# Patient Record
Sex: Female | Born: 1955 | Race: White | Hispanic: No | Marital: Single | Smoking: Former smoker
Health system: Southern US, Community
[De-identification: ages and names within clinical notes are randomized; demographics above are authoritative.]

## PROBLEM LIST (undated history)

## (undated) HISTORY — PX: TONSILLECTOMY: SUR1361

---

## 1994-11-09 HISTORY — PX: ABDOMINAL HYSTERECTOMY: SHX81

## 2016-10-01 ENCOUNTER — Encounter (HOSPITAL_BASED_OUTPATIENT_CLINIC_OR_DEPARTMENT_OTHER): Payer: Self-pay | Admitting: *Deleted

## 2016-10-01 ENCOUNTER — Emergency Department (HOSPITAL_BASED_OUTPATIENT_CLINIC_OR_DEPARTMENT_OTHER): Payer: BC Managed Care – PPO

## 2016-10-01 ENCOUNTER — Emergency Department (HOSPITAL_BASED_OUTPATIENT_CLINIC_OR_DEPARTMENT_OTHER)
Admission: EM | Admit: 2016-10-01 | Discharge: 2016-10-01 | Disposition: A | Discharge status: Home / Self Care | Payer: BC Managed Care – PPO | Attending: Emergency Medicine | Admitting: Emergency Medicine

## 2016-10-01 DIAGNOSIS — S0101XA Laceration without foreign body of scalp, initial encounter: Secondary | ICD-10-CM

## 2016-10-01 DIAGNOSIS — Y92000 Kitchen of unspecified non-institutional (private) residence as  the place of occurrence of the external cause: Secondary | ICD-10-CM | POA: Diagnosis not present

## 2016-10-01 DIAGNOSIS — Y9301 Activity, walking, marching and hiking: Secondary | ICD-10-CM | POA: Diagnosis not present

## 2016-10-01 DIAGNOSIS — W1839XA Other fall on same level, initial encounter: Secondary | ICD-10-CM | POA: Insufficient documentation

## 2016-10-01 DIAGNOSIS — R55 Syncope and collapse: Secondary | ICD-10-CM | POA: Diagnosis not present

## 2016-10-01 DIAGNOSIS — Y999 Unspecified external cause status: Secondary | ICD-10-CM | POA: Insufficient documentation

## 2016-10-01 DIAGNOSIS — S0990XA Unspecified injury of head, initial encounter: Secondary | ICD-10-CM | POA: Diagnosis present

## 2016-10-01 DIAGNOSIS — Z23 Encounter for immunization: Secondary | ICD-10-CM | POA: Insufficient documentation

## 2016-10-01 LAB — BASIC METABOLIC PANEL
Anion gap: 8 (ref 5–15)
BUN: 15 mg/dL (ref 6–20)
CHLORIDE: 101 mmol/L (ref 101–111)
CO2: 29 mmol/L (ref 22–32)
CREATININE: 0.84 mg/dL (ref 0.44–1.00)
Calcium: 9.4 mg/dL (ref 8.9–10.3)
GFR calc Af Amer: >60 mL/min (ref >60)
GFR calc non Af Amer: >60 mL/min (ref >60)
GLUCOSE: 131 mg/dL — AB (ref 65–99)
Potassium: 3.9 mmol/L (ref 3.5–5.1)
SODIUM: 138 mmol/L (ref 135–145)

## 2016-10-01 LAB — CBC WITH DIFFERENTIAL/PLATELET
Basophils Absolute: 0 10*3/uL (ref 0.0–0.1)
Basophils Relative: 0 %
EOS ABS: 0.1 10*3/uL (ref 0.0–0.7)
Eosinophils Relative: 1 %
HCT: 42.6 % (ref 36.0–46.0)
HEMOGLOBIN: 13.9 g/dL (ref 12.0–15.0)
LYMPHS ABS: 1.1 10*3/uL (ref 0.7–4.0)
LYMPHS PCT: 14 %
MCH: 29.8 pg (ref 26.0–34.0)
MCHC: 32.6 g/dL (ref 30.0–36.0)
MCV: 91.4 fL (ref 78.0–100.0)
MONOS PCT: 5 %
Monocytes Absolute: 0.4 10*3/uL (ref 0.1–1.0)
NEUTROS PCT: 80 %
Neutro Abs: 6.6 10*3/uL (ref 1.7–7.7)
Platelets: 177 10*3/uL (ref 150–400)
RBC: 4.66 MIL/uL (ref 3.87–5.11)
RDW: 13.9 % (ref 11.5–15.5)
WBC: 8.3 10*3/uL (ref 4.0–10.5)

## 2016-10-01 LAB — CBG MONITORING, ED
Comment 1: 30709026
GLUCOSE-CAPILLARY: 104 mg/dL — AB (ref 65–99)

## 2016-10-01 MED ORDER — LIDOCAINE-EPINEPHRINE (PF) 1 %-1:200000 IJ SOLN
INTRAMUSCULAR | Status: AC
  Filled 2016-10-01: qty 30

## 2016-10-01 MED ORDER — LIDOCAINE-EPINEPHRINE 2 %-1:100000 IJ SOLN
20.0000 mL | Freq: Once | INTRAMUSCULAR | Status: AC
  Administered 2016-10-01: 20 mL via INTRADERMAL

## 2016-10-01 MED ORDER — SODIUM CHLORIDE 0.9 % IV SOLN
INTRAVENOUS | Status: DC
  Administered 2016-10-01: 13:00:00 via INTRAVENOUS

## 2016-10-01 MED ORDER — TETANUS-DIPHTH-ACELL PERTUSSIS 5-2.5-18.5 LF-MCG/0.5 IM SUSP
0.5000 mL | Freq: Once | INTRAMUSCULAR | Status: AC
  Administered 2016-10-01: 0.5 mL via INTRAMUSCULAR
  Filled 2016-10-01: qty 0.5

## 2016-10-01 NOTE — ED Notes (Signed)
Returned from xray

## 2016-10-01 NOTE — ED Triage Notes (Signed)
[facility] reports patient was standing and went to the refrigerator to get something and had a witnessed fall.  Patient fell and hit her head on a knob.  Deep laceration to right scalp.  LOC for several seconds and came too.  CBG 136, VS: 140/68, hr 72, RR 18.  #18 G angio cath inserted in left AC.

## 2016-10-01 NOTE — ED Notes (Signed)
Patient denies pain and is resting comfortably.  

## 2016-10-01 NOTE — ED Triage Notes (Signed)
Patient states she was up in the kitchen getting her food ready for dinner, had not eaten anything and had had a couple cups of coffee.  States she began to have nausea and dizziness, and walked to the refridgerator to get some water, fainted and fell, striking the right lateral scalp.  Deep laceration noted.

## 2016-10-01 NOTE — Discharge Instructions (Signed)
Follow up with a primary care doctor or urgent care, Staple removal in 7-10 days.  Return as needed for recurrent symptoms, severe headache, other concerns

## 2016-10-01 NOTE — ED Provider Notes (Signed)
[facility] Provider Note   CSN: [id number] Arrival date & time: 10/01/16  1110     History   Chief Complaint Chief Complaint  Patient presents with  . Loss of Consciousness    HPI Megan Stephens is a 60 y.o. female.  HPI Patient presents to the emergency room after having a syncopal episode and sustaining a head injury. Patient was up this morning. She had a couple cups of coffee before getting anything to eat. She started to feel nauseated and became lightheaded. She began to walk to the refrigerator to get some water when she had a brief syncopal episode and fell hitting her head on a knob.  She briefly lost consciousness for a few seconds.   Patient denied any confusion after the event .  She sustained a laceration to the back of her scalp. 911 was called and the patient was brought to the emergency room.  Right now the patient has a headache at the location of her laceration. She denies any dizziness. No numbness or weakness. No chest pain or shortness of breath. No history of any syncopal episodes. History reviewed. No pertinent past medical history.  There are no active problems to display for this patient.   Past Surgical History:  Procedure Laterality Date  . ABDOMINAL HYSTERECTOMY  1996    OB History    No data available       Home Medications    Prior to Admission medications   Not on File    Family History No family history on file.  Social History Social History  Substance Use Topics  . Smoking status: Former Smoker    Quit date: 1982  . Smokeless tobacco: Never Used  . Alcohol use Yes     Comment: rare     Allergies   Patient has no known allergies.   Review of Systems Review of Systems  All other systems reviewed and are negative.    Physical Exam Updated Vital Signs BP (!) 151/129   Pulse 73   Temp 97.8 F (36.6 C) (Oral)   Resp 22   Ht 5' 5.5" (1.664 m)   Wt 112 kg   SpO2 100%   BMI 40.48 kg/m   Physical Exam    Constitutional: She appears well-developed and well-nourished. No distress.  HENT:  Head: Normocephalic.  Right Ear: External ear normal.  Left Ear: External ear normal.  Laceration right posterior scalp, matted blood in her hair  Eyes: Conjunctivae are normal. Right eye exhibits no discharge. Left eye exhibits no discharge. No scleral icterus.  Neck: Neck supple. No tracheal deviation present.  Cardiovascular: Normal rate, regular rhythm and intact distal pulses.   Pulmonary/Chest: Effort normal and breath sounds normal. No stridor. No respiratory distress. She has no wheezes. She has no rales.  Abdominal: Soft. Bowel sounds are normal. She exhibits no distension. There is no tenderness. There is no rebound and no guarding.  Musculoskeletal: She exhibits no edema or tenderness.  Entire spine nontender, no tenderness in her extremity  Neurological: She is alert. She has normal strength. No cranial nerve deficit (no facial droop, extraocular movements intact, no slurred speech) or sensory deficit. She exhibits normal muscle tone. She displays no seizure activity. Coordination normal.  Skin: Skin is warm and dry. No rash noted.  Psychiatric: She has a normal mood and affect.  Nursing note and vitals reviewed.    ED Treatments / Results  Labs (all labs ordered are listed, but only abnormal results  are displayed) Labs Reviewed  BASIC METABOLIC PANEL - Abnormal; Notable for the following:       Result Value   Glucose, Bld 131 (*)    All other components within normal limits  CBG MONITORING, ED - Abnormal; Notable for the following:    Glucose-Capillary 104 (*)    All other components within normal limits  CBC WITH DIFFERENTIAL/PLATELET    EKG  EKG Interpretation  Date/Time:  Thursday October 01 2016 11:23:35 EST Ventricular Rate:  70 PR Interval:    QRS Duration: 86 QT Interval:  405 QTC Calculation: 437 R Axis:   43 Text Interpretation:  Sinus rhythm Low voltage, precordial  leads No previous tracing Confirmed by Tasneem Cormier  MD-J, Argusta Mcgann ([id number]) on 10/01/2016 11:38:40 AM       Radiology Ct Head Wo Contrast  Result Date: 10/01/2016 CLINICAL DATA:  Fall, head injury, right parietal swelling and laceration EXAM: CT HEAD WITHOUT CONTRAST TECHNIQUE: Contiguous axial images were obtained from the base of the skull through the vertex without intravenous contrast. COMPARISON:  None available FINDINGS: Brain: No evidence of acute infarction, hemorrhage, hydrocephalus, extra-axial collection or mass lesion/mass effect. Vascular: No hyperdense vessel or unexpected calcification. Skull: Negative for fracture. High right parietal superficial scalp hematoma noted. Sinuses/Orbits: No acute finding. Other: None. IMPRESSION: Small right parietal scalp hematoma. No acute intracranial finding. Electronically Signed   By: Judie PetitM.  Shick M.D.   On: 10/01/2016 12:12    Procedures .Marland Kitchen.Laceration Repair Date/Time: 10/01/2016 12:34 PM Performed by: Linwood DibblesKNAPP, Lauran Romanski Authorized by: Linwood DibblesKNAPP, Kearstin Learn   Consent:    Consent obtained:  Verbal   Consent given by:  Patient   Risks discussed:  Infection, need for additional repair, pain, poor cosmetic result and poor wound healing   Alternatives discussed:  No treatment and delayed treatment Anesthesia (see MAR for exact dosages):    Anesthesia method:  Local infiltration   Local anesthetic:  Lidocaine 1% WITH epi Laceration details:    Location:  Scalp Repair type:    Repair type:  Simple Pre-procedure details:    Preparation:  Patient was prepped and draped in usual sterile fashion Exploration:    Contaminated: no   Treatment:    Area cleansed with:  Shur-Clens   Amount of cleaning:  Standard   Irrigation method:  Pressure wash   Visualized foreign bodies/material removed: no   Skin repair:    Repair method:  Staples   Number of staples:  4 Approximation:    Approximation:  Close   Vermilion border: well-aligned   Post-procedure details:    Dressing:   Open (no dressing)   Patient tolerance of procedure:  Tolerated well, no immediate complications    (including critical care time)  Medications Ordered in ED Medications  0.9 %  sodium chloride infusion ( Intravenous New Bag/Given 10/01/16 1238)  lidocaine-EPINEPHrine (XYLOCAINE-EPINEPHrine) 1 %-1:200000 (PF) injection (not administered)  Tdap (BOOSTRIX) injection 0.5 mL (0.5 mLs Intramuscular Given 10/01/16 1234)  lidocaine-EPINEPHrine (XYLOCAINE W/EPI) 2 %-1:100000 (with pres) injection 20 mL (20 mLs Intradermal Given by Other 10/01/16 1234)     Initial Impression / Assessment and Plan / ED Course  I have reviewed the triage vital signs and the nursing notes.  Pertinent labs & imaging results that were available during my care of the patient were reviewed by me and considered in my medical decision making (see chart for details).  Clinical Course       Pt has no risk factors for cardiac dysrhythmia.  No chest pain  or shortness of breath.  Doubt cardiac or pulmonary etiology.  Sx not suggestive of TIA, stroke. Most likely a vasovagal episode.  Laceration repaired without difficulty.  Discu ssed dc and follow up plans  Final Clinical Impressions(s) / ED Diagnoses   Final diagnoses:  Syncope and collapse  Laceration of scalp, initial encounter    New Prescriptions New Prescriptions   No medications on file     Linwood Dibbles, MD 10/01/16 1255

## 2018-06-04 IMAGING — CT CT HEAD W/O CM
3 series · 16 of 47 positions shown, 19 images · non-contrast
Comparison: None available

CLINICAL DATA: Fall, head injury, right parietal swelling and
laceration

EXAM:
CT HEAD WITHOUT CONTRAST
TECHNIQUE: Contiguous axial images were obtained from the base of the skull
through the vertex without intravenous contrast.

[Series 2: head wo · axial · 0.42mm/px · z∈[-211,-66]mm · 10 of 35 slices shown, 13 images]
[im 3/35  brain]
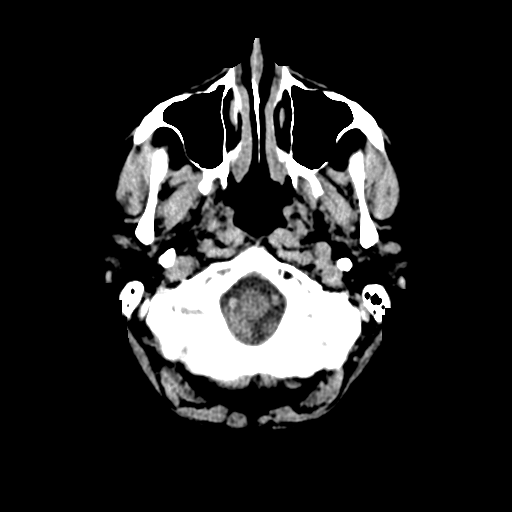
[im 3/35  bone]
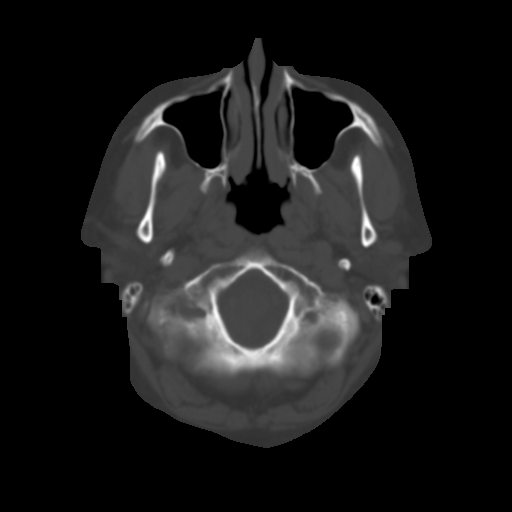
[im 6/35  brain]
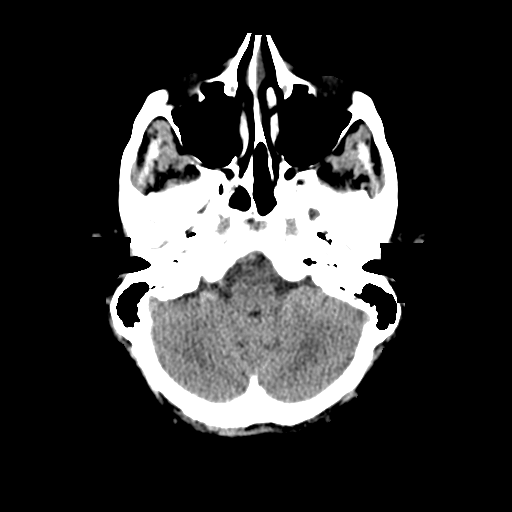
[im 10/35  brain]
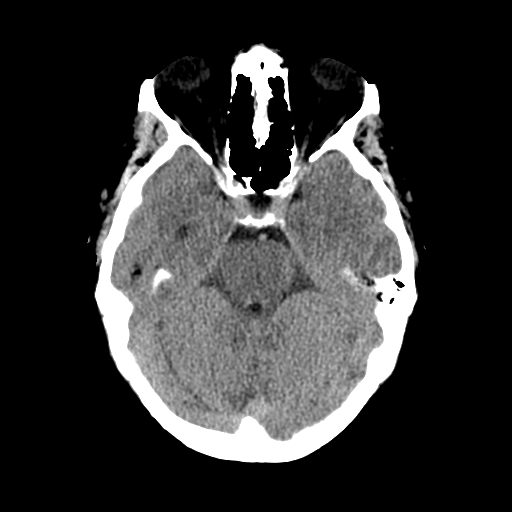
[im 12/35  brain]
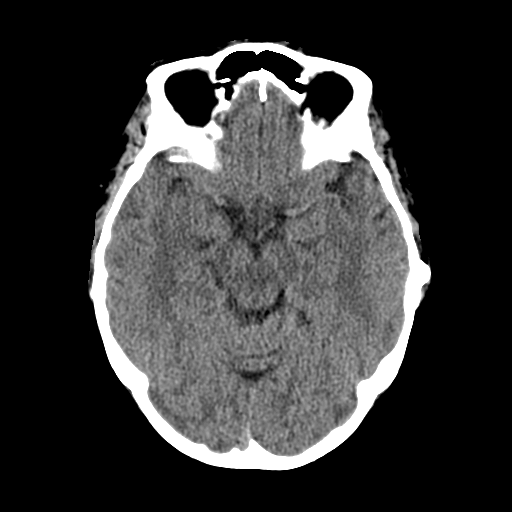
[im 16/35  brain]
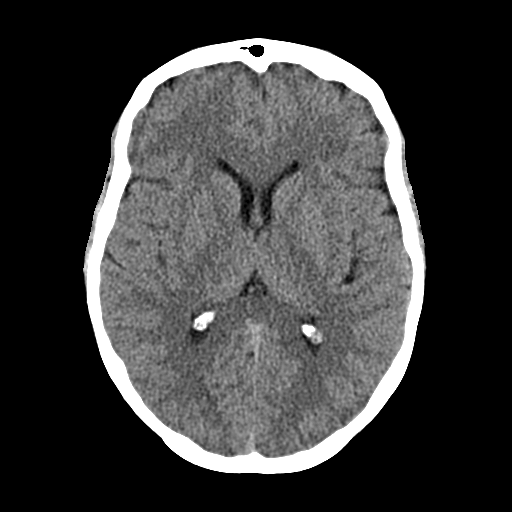
[im 16/35  bone]
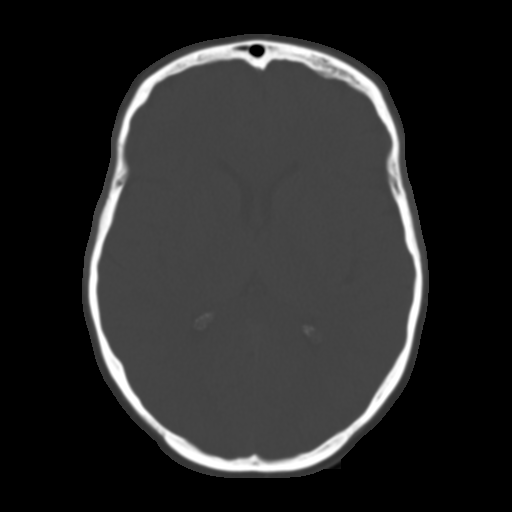
[im 19/35  brain]
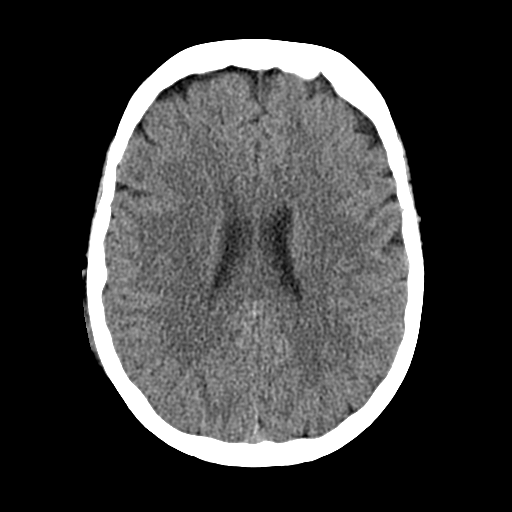
[im 23/35  brain]
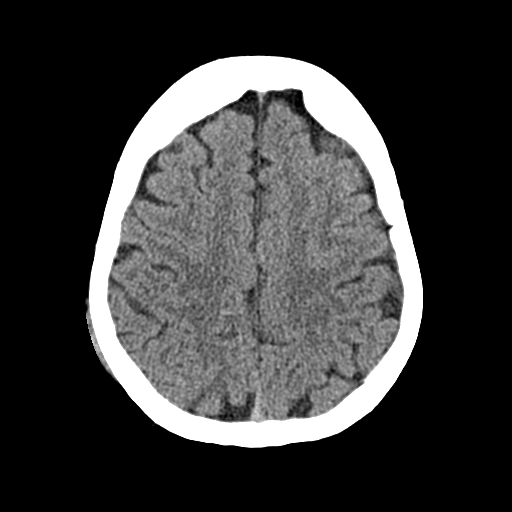
[im 26/35  brain]
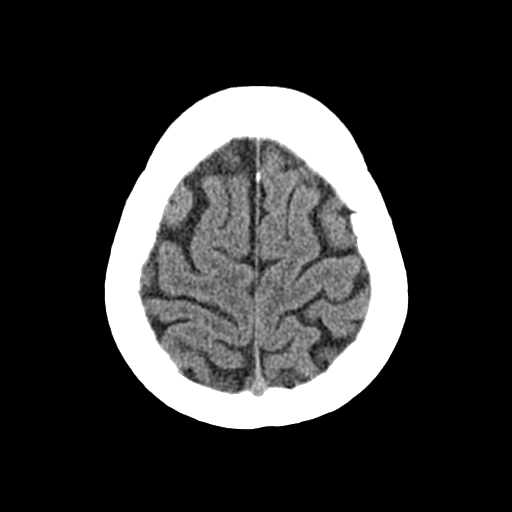
[im 29/35  brain]
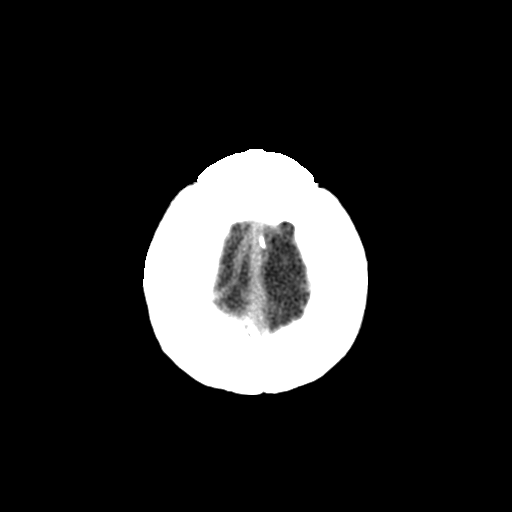
[im 29/35  bone]
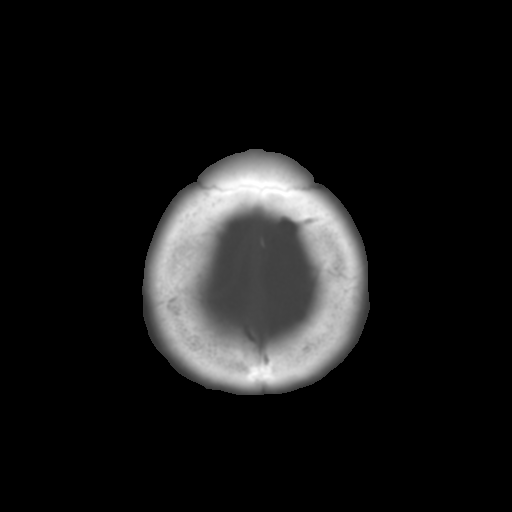
[im 32/35  brain]
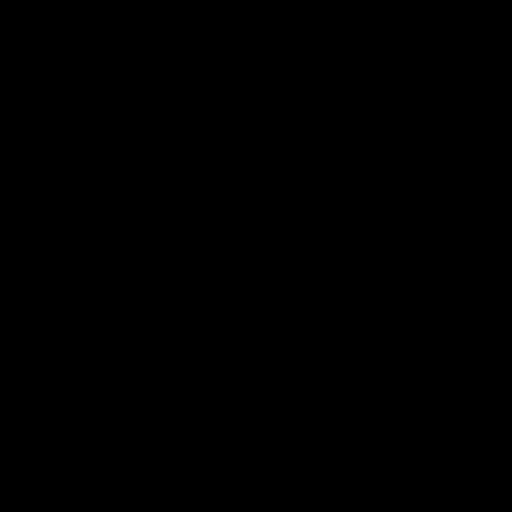

[Series 4: cor soft · coronal · 0.33mm/px · 3 of 67 slices shown]
[im 23/67  brain]
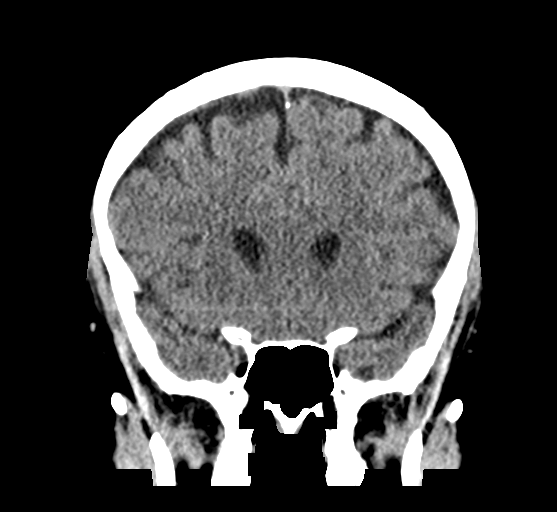
[im 30/67  brain]
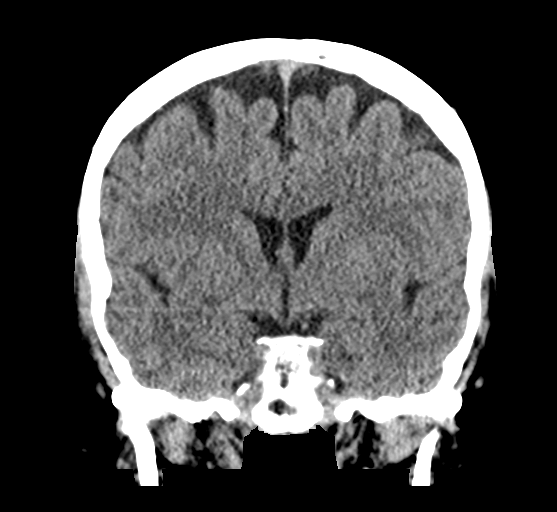
[im 37/67  brain]
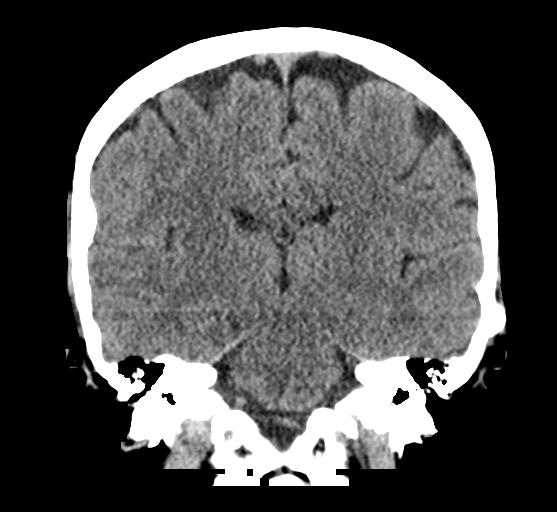

[Series 5: sag soft · sagittal · 0.35mm/px · 3 of 54 slices shown]
[im 18/54  brain]
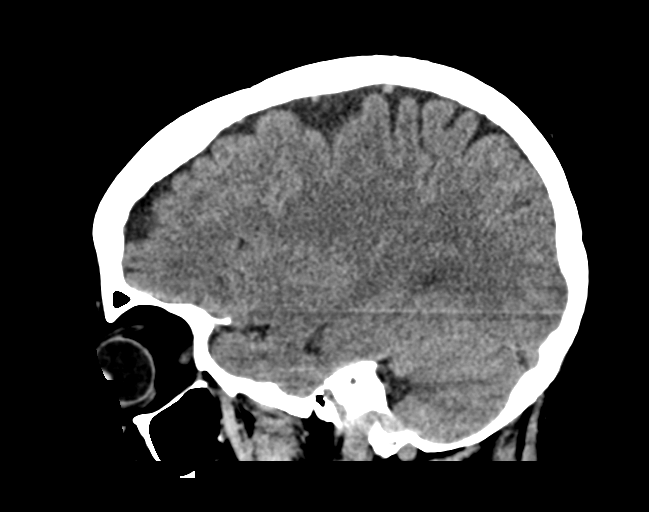
[im 27/54  brain]
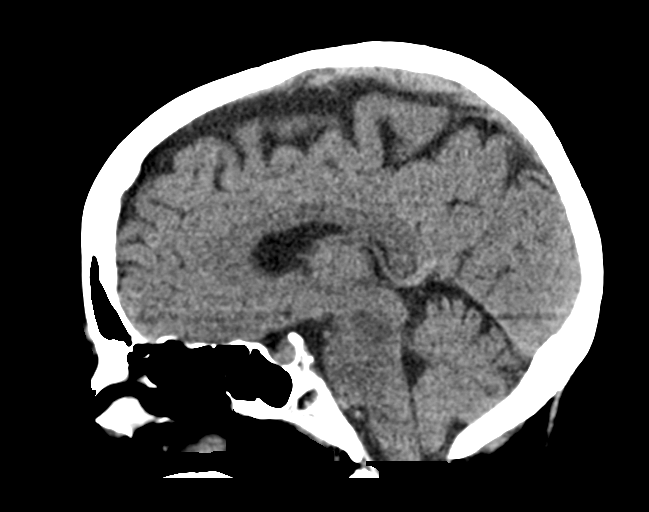
[im 36/54  brain]
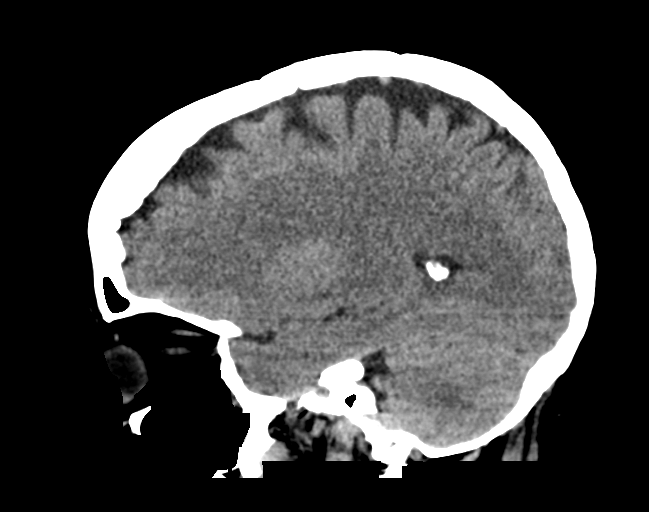

[16 of 47 positions shown; findings below may reference images not displayed]

FINDINGS: Brain: No evidence of acute infarction, hemorrhage, hydrocephalus,
extra-axial collection or mass lesion/mass effect.

Vascular: No hyperdense vessel or unexpected calcification.

Skull: Negative for fracture. High right parietal superficial scalp
hematoma noted.

Sinuses/Orbits: No acute finding.

Other: None.
IMPRESSION: Small right parietal scalp hematoma.

No acute intracranial finding.

## 2019-10-23 ENCOUNTER — Other Ambulatory Visit: Payer: Self-pay | Admitting: Orthopedic Surgery

## 2019-10-23 ENCOUNTER — Other Ambulatory Visit: Payer: Self-pay

## 2019-10-23 ENCOUNTER — Encounter (HOSPITAL_BASED_OUTPATIENT_CLINIC_OR_DEPARTMENT_OTHER): Payer: Self-pay | Admitting: Orthopedic Surgery

## 2019-10-24 ENCOUNTER — Other Ambulatory Visit (HOSPITAL_COMMUNITY)
Admission: RE | Admit: 2019-10-24 | Discharge: 2019-10-24 | Disposition: A | Discharge status: Home / Self Care | Payer: Medicare Other | Source: Ambulatory Visit | Attending: Orthopedic Surgery | Admitting: Orthopedic Surgery

## 2019-10-24 DIAGNOSIS — Z20828 Contact with and (suspected) exposure to other viral communicable diseases: Secondary | ICD-10-CM | POA: Insufficient documentation

## 2019-10-24 DIAGNOSIS — Z01812 Encounter for preprocedural laboratory examination: Secondary | ICD-10-CM | POA: Diagnosis present

## 2019-10-24 NOTE — Progress Notes (Signed)

## 2019-10-25 LAB — NOVEL CORONAVIRUS, NAA (HOSP ORDER, SEND-OUT TO REF LAB; TAT 18-24 HRS): SARS-CoV-2, NAA: NOT DETECTED

## 2019-10-27 ENCOUNTER — Ambulatory Visit (HOSPITAL_BASED_OUTPATIENT_CLINIC_OR_DEPARTMENT_OTHER): Payer: Medicare Other | Admitting: Anesthesiology

## 2019-10-27 ENCOUNTER — Encounter (HOSPITAL_BASED_OUTPATIENT_CLINIC_OR_DEPARTMENT_OTHER)
Admission: RE | Disposition: A | Discharge status: Home / Self Care | Payer: Self-pay | Source: Home / Self Care | Attending: Orthopedic Surgery

## 2019-10-27 ENCOUNTER — Ambulatory Visit (HOSPITAL_BASED_OUTPATIENT_CLINIC_OR_DEPARTMENT_OTHER)
Admission: RE | Admit: 2019-10-27 | Discharge: 2019-10-27 | Disposition: A | Discharge status: Home / Self Care | Payer: Medicare Other | Attending: Orthopedic Surgery | Admitting: Orthopedic Surgery

## 2019-10-27 ENCOUNTER — Encounter (HOSPITAL_BASED_OUTPATIENT_CLINIC_OR_DEPARTMENT_OTHER): Payer: Self-pay | Admitting: Orthopedic Surgery

## 2019-10-27 DIAGNOSIS — S52571A Other intraarticular fracture of lower end of right radius, initial encounter for closed fracture: Secondary | ICD-10-CM | POA: Insufficient documentation

## 2019-10-27 DIAGNOSIS — W19XXXA Unspecified fall, initial encounter: Secondary | ICD-10-CM | POA: Insufficient documentation

## 2019-10-27 DIAGNOSIS — Z87891 Personal history of nicotine dependence: Secondary | ICD-10-CM | POA: Insufficient documentation

## 2019-10-27 DIAGNOSIS — Z6839 Body mass index (BMI) 39.0-39.9, adult: Secondary | ICD-10-CM | POA: Insufficient documentation

## 2019-10-27 DIAGNOSIS — S52501A Unspecified fracture of the lower end of right radius, initial encounter for closed fracture: Secondary | ICD-10-CM | POA: Diagnosis present

## 2019-10-27 HISTORY — PX: OPEN REDUCTION INTERNAL FIXATION (ORIF) DISTAL RADIAL FRACTURE: SHX5989

## 2019-10-27 SURGERY — OPEN REDUCTION INTERNAL FIXATION (ORIF) DISTAL RADIUS FRACTURE
Anesthesia: General | Site: Wrist | Laterality: Right

## 2019-10-27 MED ORDER — ROPIVACAINE HCL 5 MG/ML IJ SOLN
INTRAMUSCULAR | Status: DC | PRN
  Administered 2019-10-27: 30 mg via PERINEURAL

## 2019-10-27 MED ORDER — CEFAZOLIN SODIUM-DEXTROSE 2-4 GM/100ML-% IV SOLN
INTRAVENOUS | Status: AC
  Filled 2019-10-27: qty 100

## 2019-10-27 MED ORDER — CHLORHEXIDINE GLUCONATE 4 % EX LIQD: 60.0000 mL | Freq: Once | CUTANEOUS | Status: DC

## 2019-10-27 MED ORDER — ACETAMINOPHEN 500 MG PO TABS
1000.0000 mg | ORAL_TABLET | Freq: Once | ORAL | Status: AC
  Administered 2019-10-27: 1000 mg via ORAL

## 2019-10-27 MED ORDER — FENTANYL CITRATE (PF) 100 MCG/2ML IJ SOLN
50.0000 ug | INTRAMUSCULAR | Status: DC | PRN
  Administered 2019-10-27: 100 ug via INTRAVENOUS

## 2019-10-27 MED ORDER — ROPIVACAINE HCL 5 MG/ML IJ SOLN: INTRAMUSCULAR | Status: DC | PRN

## 2019-10-27 MED ORDER — DEXAMETHASONE SODIUM PHOSPHATE 10 MG/ML IJ SOLN
INTRAMUSCULAR | Status: DC | PRN
  Administered 2019-10-27: 5 mg via INTRAVENOUS

## 2019-10-27 MED ORDER — PROPOFOL 500 MG/50ML IV EMUL
INTRAVENOUS | Status: AC
  Filled 2019-10-27: qty 50

## 2019-10-27 MED ORDER — FENTANYL CITRATE (PF) 100 MCG/2ML IJ SOLN
INTRAMUSCULAR | Status: AC
  Filled 2019-10-27: qty 2

## 2019-10-27 MED ORDER — MIDAZOLAM HCL 2 MG/2ML IJ SOLN
1.0000 mg | INTRAMUSCULAR | Status: DC | PRN
  Administered 2019-10-27: 2 mg via INTRAVENOUS

## 2019-10-27 MED ORDER — CEFAZOLIN SODIUM-DEXTROSE 2-4 GM/100ML-% IV SOLN: 2.0000 g | INTRAVENOUS | Status: DC

## 2019-10-27 MED ORDER — LACTATED RINGERS IV SOLN: INTRAVENOUS | Status: DC

## 2019-10-27 MED ORDER — CELECOXIB 200 MG PO CAPS
ORAL_CAPSULE | ORAL | Status: AC
  Filled 2019-10-27: qty 1

## 2019-10-27 MED ORDER — ACETAMINOPHEN 500 MG PO TABS
ORAL_TABLET | ORAL | Status: AC
  Filled 2019-10-27: qty 2

## 2019-10-27 MED ORDER — PROPOFOL 10 MG/ML IV BOLUS
INTRAVENOUS | Status: DC | PRN
  Administered 2019-10-27: 200 mg via INTRAVENOUS

## 2019-10-27 MED ORDER — ONDANSETRON HCL 4 MG/2ML IJ SOLN
INTRAMUSCULAR | Status: DC | PRN
  Administered 2019-10-27: 4 mg via INTRAVENOUS

## 2019-10-27 MED ORDER — CELECOXIB 400 MG PO CAPS
400.0000 mg | ORAL_CAPSULE | Freq: Once | ORAL | Status: AC
  Administered 2019-10-27: 400 mg via ORAL

## 2019-10-27 MED ORDER — OXYCODONE HCL 5 MG PO TABS: 5.0000 mg | ORAL_TABLET | Freq: Once | ORAL | Status: DC

## 2019-10-27 MED ORDER — FENTANYL CITRATE (PF) 100 MCG/2ML IJ SOLN: 25.0000 ug | INTRAMUSCULAR | Status: DC | PRN

## 2019-10-27 MED ORDER — MIDAZOLAM HCL 2 MG/2ML IJ SOLN
INTRAMUSCULAR | Status: AC
  Filled 2019-10-27: qty 2

## 2019-10-27 MED ORDER — HYDROCODONE-ACETAMINOPHEN 5-325 MG PO TABS: ORAL_TABLET | ORAL | 0 refills | Status: AC

## 2019-10-27 MED ORDER — PROMETHAZINE HCL 25 MG/ML IJ SOLN: 6.2500 mg | INTRAMUSCULAR | Status: DC | PRN

## 2019-10-27 SURGICAL SUPPLY — 56 items
BIT DRILL 2.0 LNG QUCK RELEASE (BIT) IMPLANT
BIT DRILL 2.8X5 QR DISP (BIT) ×2 IMPLANT
BLADE SURG 15 STRL LF DISP TIS (BLADE) ×2 IMPLANT
BLADE SURG 15 STRL SS (BLADE) ×4
BNDG ELASTIC 3X5.8 VLCR STR LF (GAUZE/BANDAGES/DRESSINGS) ×3 IMPLANT
BNDG ESMARK 4X9 LF (GAUZE/BANDAGES/DRESSINGS) ×3 IMPLANT
BNDG GAUZE ELAST 4 BULKY (GAUZE/BANDAGES/DRESSINGS) ×3 IMPLANT
BNDG PLASTER X FAST 3X3 WHT LF (CAST SUPPLIES) ×30 IMPLANT
CHLORAPREP W/TINT 26 (MISCELLANEOUS) ×3 IMPLANT
CORD BIPOLAR FORCEPS 12FT (ELECTRODE) ×3 IMPLANT
COVER BACK TABLE REUSABLE LG (DRAPES) ×3 IMPLANT
COVER MAYO STAND REUSABLE (DRAPES) ×3 IMPLANT
COVER WAND RF STERILE (DRAPES) IMPLANT
CUFF TOURN SGL QUICK 18X4 (TOURNIQUET CUFF) IMPLANT
CUFF TOURN SGL QUICK 24 (TOURNIQUET CUFF)
CUFF TRNQT CYL 24X4X16.5-23 (TOURNIQUET CUFF) IMPLANT
DRAPE EXTREMITY T 121X128X90 (DISPOSABLE) ×3 IMPLANT
DRAPE OEC MINIVIEW 54X84 (DRAPES) ×3 IMPLANT
DRAPE SURG 17X23 STRL (DRAPES) ×3 IMPLANT
DRILL 2.0 LNG QUICK RELEASE (BIT) ×3
GAUZE SPONGE 4X4 12PLY STRL (GAUZE/BANDAGES/DRESSINGS) ×3 IMPLANT
GAUZE XEROFORM 1X8 LF (GAUZE/BANDAGES/DRESSINGS) ×3 IMPLANT
GLOVE BIO SURGEON STRL SZ7.5 (GLOVE) ×5 IMPLANT
GLOVE BIOGEL PI IND STRL 8 (GLOVE) ×1 IMPLANT
GLOVE BIOGEL PI IND STRL 8.5 (GLOVE) IMPLANT
GLOVE BIOGEL PI INDICATOR 8 (GLOVE) ×4
GLOVE BIOGEL PI INDICATOR 8.5 (GLOVE) ×2
GLOVE SURG ORTHO 8.0 STRL STRW (GLOVE) IMPLANT
GOWN STRL REUS W/ TWL LRG LVL3 (GOWN DISPOSABLE) ×1 IMPLANT
GOWN STRL REUS W/TWL LRG LVL3 (GOWN DISPOSABLE)
GOWN STRL REUS W/TWL XL LVL3 (GOWN DISPOSABLE) ×7 IMPLANT
GUIDEWIRE ORTHO 0.054X6 (WIRE) ×6 IMPLANT
NDL HYPO 25X1 1.5 SAFETY (NEEDLE) IMPLANT
NEEDLE HYPO 25X1 1.5 SAFETY (NEEDLE) IMPLANT
NS IRRIG 1000ML POUR BTL (IV SOLUTION) ×3 IMPLANT
PACK BASIN DAY SURGERY FS (CUSTOM PROCEDURE TRAY) ×3 IMPLANT
PAD CAST 3X4 CTTN HI CHSV (CAST SUPPLIES) ×1 IMPLANT
PADDING CAST COTTON 3X4 STRL (CAST SUPPLIES) ×2
PLATE PROXIMAL VDU ACULOC (Plate) ×2 IMPLANT
SCREW CORT FT 18X2.3XLCK HEX (Screw) IMPLANT
SCREW CORT FT 20X2.3XLCK HEX (Screw) IMPLANT
SCREW CORTICAL LOCKING 2.3X18M (Screw) ×2 IMPLANT
SCREW CORTICAL LOCKING 2.3X20M (Screw) ×10 IMPLANT
SCREW FX20X2.3XSMTH LCK NS CRT (Screw) IMPLANT
SCREW NLCKG 13 3.5X13 HEXA (Screw) IMPLANT
SCREW NON LOCK 3.5X10MM (Screw) ×2 IMPLANT
SCREW NON-LOCK 3.5X13 (Screw) ×3 IMPLANT
SCREW NONLOCK HEX 3.5X12 (Screw) ×2 IMPLANT
SLEEVE SCD COMPRESS KNEE MED (MISCELLANEOUS) IMPLANT
STOCKINETTE 4X48 STRL (DRAPES) ×3 IMPLANT
SUT ETHILON 4 0 PS 2 18 (SUTURE) ×3 IMPLANT
SUT VICRYL 4-0 PS2 18IN ABS (SUTURE) ×3 IMPLANT
SYR BULB 3OZ (MISCELLANEOUS) ×3 IMPLANT
SYR CONTROL 10ML LL (SYRINGE) IMPLANT
TOWEL GREEN STERILE FF (TOWEL DISPOSABLE) ×6 IMPLANT
UNDERPAD 30X36 HEAVY ABSORB (UNDERPADS AND DIAPERS) ×3 IMPLANT

## 2019-10-27 NOTE — Op Note (Signed)
10/27/2019 [facility]  Operative Note  Pre Op Diagnosis: Right comminuted intraarticular distal radius fracture  Post Op Diagnosis: Right comminuted intraarticular distal radius fracture  Procedure:  1. ORIF Right comminuted intraarticular distal radius fracture, 3 intraarticular fragments 2. Right brachioradialis release  Surgeon: Betha Loa, MD  Assistant: Cindee Salt, MD  Anesthesia: General with regional  Fluids: Per anesthesia flow sheet  EBL: minimal  Complications: None  Specimen: None  Tourniquet Time:  Total Tourniquet Time Documented: Upper Arm (Right) - 46 minutes Total: Upper Arm (Right) - 46 minutes   Disposition: Stable to PACU  INDICATIONS:  Megan Stephens is a 63 y.o. female who states she fell earlier this week injuring her right wrist.  Seen at [facility] where XR revealed right distal radius fracture.  Splinted and followed up in the office.  We discussed nonoperative and operative treatment options.  She wished to proceed with operative fixation.  Risks, benefits, and alternatives of surgery were discussed including the risk of blood loss; infection; damage to nerves, vessels, tendons, ligaments, bone; failure of surgery; need for additional surgery; complications with wound healing; continued pain; nonunion; malunion; stiffness.  We also discussed the possible need for bone graft and the benefits and risks including the possibility of disease transmission.  She voiced understanding of these risks and elected to proceed.    OPERATIVE COURSE:  After being identified preoperatively by myself, the patient and I agreed upon the procedure and site of procedure.  Surgical site was marked.  The risks, benefits and alternatives of the surgery were reviewed and she wished to proceed.  Surgical consent had been signed.  She was given IV Ancef as preoperative antibiotic prophylaxis.  She was transferred to the operating room and placed on the operating room table  in supine position with the Right upper extremity on an armboard. General anesthesia was induced by the anesthesiologist.  A regional block had been performed by anesthesia in preoperative holding.  The Right upper extremity was prepped and draped in normal sterile orthopedic fashion.  A surgical pause was performed between the surgeons, anesthesia and operating room staff, and all were in agreement as to the patient, procedure and site of procedure.  Tourniquet at the proximal aspect of the extremity was inflated to 250 mmHg after exsanguination of the limb with an Esmarch bandage.  Standard volar Sherilyn Cooter approach was used.  The bipolar electrocautery was used to obtain hemostasis.  The superficial and deep portions of the FCR tendon sheath were incised, and the FCR and FPL were swept ulnarly to protect the palmar cutaneous branch of the median nerve.  The brachioradialis was released at the radial side of the radius.  The pronator quadratus was released and elevated with the periosteal elevator.  The fracture site was identified and cleared of soft tissue interposition and hematoma.  It was reduced under direct visualization.  There was intraarticular extension creating three intraarticular fragments.  An AcuMed volar distal radial locking plate was selected.  It was secured to the bone with the guidepins.  C-arm was used in AP and lateral projections to ensure appropriate reduction and position of the hardware and adjustments made as necessary.  Standard AO drilling and measuring technique was used.  A single screw was placed in the slotted hole in the shaft of the plate.  The distal holes were filled with locking pegs with the exception of the styloid holes, which were filled with locking screws.  The remaining holes in the shaft  of the plate were filled with nonlocking screws.  Good purchase was obtained.  C-arm was used in AP, lateral and oblique projections to ensure appropriate reduction and position of  hardware, which was the case.  There was no intra-articular penetration of hardware.  The wound was copiously irrigated with sterile saline.  Pronator quadratus was repaired back over top of the plate using 4-0 Vicryl suture.  Vicryl suture was placed in the subcutaneous tissues in an inverted interrupted fashion and the skin was closed with 4-0 nylon in a horizontal mattress fashion.  There was good pronation and supination of the wrist without crepitance.  The wound was then dressed with sterile Xeroform, 4x4s, and wrapped with a Kerlix bandage.  A volar splint was placed and wrapped with Kerlix and Ace bandage.  Tourniquet was deflated at 46 minutes.  Fingertips were pink with brisk capillary refill after deflation of the tourniquet.  Operative drapes were broken down.  The patient was awoken from anesthesia safely.  She was transferred back to the stretcher and taken to the PACU in stable condition.  I will see her back in the office in one week for postoperative followup.  I will give her a prescription for Norco 5/325 1-2 tabs PO q6 hours prn pain, dispense # 30.    Leanora Cover, MD Electronically signed, 10/27/19

## 2019-10-27 NOTE — Anesthesia Procedure Notes (Signed)
Anesthesia Regional Block: Interscalene brachial plexus block   Pre-Anesthetic Checklist: ,, timeout performed, Correct Patient, Correct Site, Correct Laterality, Correct Procedure, Correct Position, site marked, Risks and benefits discussed,  Surgical consent,  Pre-op evaluation,  At surgeon's request and post-op pain management  Laterality: Right  Prep: chloraprep       Needles:  Injection technique: Single-shot  Needle Type: Echogenic Stimulator Needle     Needle Length: 5cm  Needle Gauge: 22     Additional Needles:   Narrative:  Start time: 10/27/2019 1:36 PM End time: 10/27/2019 1:46 PM Injection made incrementally with aspirations every 5 mL.  Performed by: Personally  Anesthesiologist: Duane Boston, MD  Additional Notes: Functioning IV was confirmed and monitors applied.  A 26mm 22ga echogenic arrow stimulator was used. Sterile prep and drape,hand hygiene and sterile gloves were used.Ultrasound guidance: relevant anatomy identified, needle position confirmed, local anesthetic spread visualized around nerve(s)., vascular puncture avoided.  Image printed for medical record.  Negative aspiration and negative test dose prior to incremental administration of local anesthetic. The patient tolerated the procedure well.

## 2019-10-27 NOTE — H&P (Signed)
  Megan Stephens is an 63 y.o. female.   Chief Complaint: right wrist fracture HPI: 63 yo female states she fell 10/23/19 injuring right wrist.  Seen at UC where XR revealed right distal radius fracture.  Splinted and followed up in office.  She wishes to proceed with operative fixation.  Allergies: No Known Allergies  History reviewed. No pertinent past medical history.  Past Surgical History:  Procedure Laterality Date  . ABDOMINAL HYSTERECTOMY  1996  . TONSILLECTOMY      Family History: History reviewed. No pertinent family history.  Social History:   reports that she quit smoking about 38 years ago. She has never used smokeless tobacco. She reports current alcohol use. She reports that she does not use drugs.  Medications: No medications prior to admission.    No results found for this or any previous visit (from the past 48 hour(s)).  No results found.   A comprehensive review of systems was negative.  Height 5\' 4"  (1.626 m), weight 103.9 kg.  General appearance: alert, cooperative and appears stated age Head: Normocephalic, without obvious abnormality, atraumatic Neck: supple, symmetrical, trachea midline Cardio: regular rate and rhythm Resp: clear to auscultation bilaterally Extremities: Intact sensation and capillary refill all digits.  +epl/fpl/io.  No wounds.  Pulses: 2+ and symmetric Skin: Skin color, texture, turgor normal. No rashes or lesions Neurologic: Grossly normal Incision/Wound: none  Assessment/Plan Right distal radius fracture.  Non operative and operative treatment options have been discussed with the patient and patient wishes to proceed with operative treatment. Risks, benefits, and alternatives of surgery have been discussed and the patient agrees with the plan of care.   Leanora Cover 10/27/2019, 1:41 PM

## 2019-10-27 NOTE — Discharge Instructions (Addendum)

## 2019-10-27 NOTE — Anesthesia Procedure Notes (Signed)
Procedure Name: LMA Insertion Date/Time: 10/27/2019 2:55 PM Performed by: Maryella Shivers, CRNA Pre-anesthesia Checklist: Patient identified, Emergency Drugs available, Suction available and Patient being monitored Patient Re-evaluated:Patient Re-evaluated prior to induction Oxygen Delivery Method: Circle system utilized Preoxygenation: Pre-oxygenation with 100% oxygen Induction Type: IV induction Ventilation: Mask ventilation without difficulty LMA: LMA inserted LMA Size: 4.0 Number of attempts: 1 Airway Equipment and Method: Bite block Placement Confirmation: positive ETCO2 Tube secured with: Tape Dental Injury: Teeth and Oropharynx as per pre-operative assessment

## 2019-10-27 NOTE — Anesthesia Preprocedure Evaluation (Addendum)
Anesthesia Evaluation  Patient identified by MRN, date of birth, ID band Patient awake    Reviewed: Allergy & Precautions, NPO status , Patient's Chart, lab work & pertinent test results  History of Anesthesia Complications Negative for: history of anesthetic complications  Airway Mallampati: II  TM Distance: >3 FB Neck ROM: Full    Dental no notable dental hx. (+) Dental Advisory Given   Pulmonary neg pulmonary ROS, former smoker,    Pulmonary exam normal        Cardiovascular negative cardio ROS Normal cardiovascular exam     Neuro/Psych negative neurological ROS     GI/Hepatic negative GI ROS, Neg liver ROS,   Endo/Other  Morbid obesity  Renal/GU negative Renal ROS     Musculoskeletal   Abdominal   Peds  Hematology negative hematology ROS (+)   Anesthesia Other Findings Day of surgery medications reviewed with the patient.  Reproductive/Obstetrics                            Anesthesia Physical Anesthesia Plan  ASA: II  Anesthesia Plan: General   Post-op Pain Management:  Regional for Post-op pain   Induction: Intravenous  PONV Risk Score and Plan: 3 and Ondansetron, Dexamethasone and Midazolam  Airway Management Planned: LMA  Additional Equipment:   Intra-op Plan:   Post-operative Plan: Extubation in OR  Informed Consent: I have reviewed the patients History and Physical, chart, labs and discussed the procedure including the risks, benefits and alternatives for the proposed anesthesia with the patient or authorized representative who has indicated his/her understanding and acceptance.     Dental advisory given  Plan Discussed with: CRNA, Anesthesiologist and Surgeon  Anesthesia Plan Comments:        Anesthesia Quick Evaluation

## 2019-10-27 NOTE — Op Note (Signed)
I assisted Surgeon(s) and Role:    * Leanora Cover, MD - Primary    Daryll Brod, MD - Assisting on the Procedure(s): OPEN REDUCTION INTERNAL FIXATION (ORIF) DISTAL RADIAL FRACTURE on 10/27/2019.  I provided assistance on this case as follows: setup,approach,debridement, reductio, stabilization,plcement of the plate and fixation of the fracture, closure of the wound and application of the dressings and splint. Electronically signed by: Daryll Brod, MD Date: 10/27/2019 Time: 3:59 PM

## 2019-10-27 NOTE — Anesthesia Postprocedure Evaluation (Signed)
Anesthesia Post Note  Patient: Megan Stephens  Procedure(s) Performed: OPEN REDUCTION INTERNAL FIXATION (ORIF) DISTAL RADIAL FRACTURE (Right Wrist)     Patient location during evaluation: PACU Anesthesia Type: General Level of consciousness: sedated Pain management: pain level controlled Vital Signs Assessment: post-procedure vital signs reviewed and stable Respiratory status: spontaneous breathing and respiratory function stable Cardiovascular status: stable Postop Assessment: no apparent nausea or vomiting Anesthetic complications: no    Last Vitals:  Vitals:   10/27/19 1615 10/27/19 1630  BP: 137/73 136/71  Pulse: 85 79  Resp: 15 13  Temp:    SpO2: 100% 99%    Last Pain:  Vitals:   10/27/19 1630  PainSc: 0-No pain                 Riddick Nuon DANIEL

## 2019-10-27 NOTE — Transfer of Care (Signed)
Immediate Anesthesia Transfer of Care Note  Patient: Megan Stephens  Procedure(s) Performed: OPEN REDUCTION INTERNAL FIXATION (ORIF) DISTAL RADIAL FRACTURE (Right Wrist)  Patient Location: PACU  Anesthesia Type:GA combined with regional for post-op pain  Level of Consciousness: sedated  Airway & Oxygen Therapy: Patient Spontanous Breathing and Patient connected to nasal cannula oxygen  Post-op Assessment: Report given to RN and Post -op Vital signs reviewed and stable  Post vital signs: Reviewed and stable  Last Vitals:  Vitals Value Taken Time  BP 127/68 10/27/19 1601  Temp    Pulse 89 10/27/19 1604  Resp 17 10/27/19 1604  SpO2 100 % 10/27/19 1604  Vitals shown include unvalidated device data.  Last Pain: There were no vitals filed for this visit.       Complications: No apparent anesthesia complications

## 2019-10-27 NOTE — Progress Notes (Signed)
Assisted Dr. Singer with right, ultrasound guided, supraclavicular block. Side rails up, monitors on throughout procedure. See vital signs in flow sheet. Tolerated Procedure well. 

## 2019-10-30 ENCOUNTER — Encounter: Payer: Self-pay | Admitting: *Deleted
